# Patient Record
Sex: Female | Born: 1975 | Race: Asian | Hispanic: No | Marital: Married | Smoking: Current every day smoker
Health system: Southern US, Community
[De-identification: ages and names within clinical notes are randomized; demographics above are authoritative.]

## PROBLEM LIST (undated history)

## (undated) DIAGNOSIS — R51 Headache: Secondary | ICD-10-CM

## (undated) DIAGNOSIS — Z9882 Breast implant status: Secondary | ICD-10-CM

## (undated) DIAGNOSIS — Z720 Tobacco use: Secondary | ICD-10-CM

## (undated) DIAGNOSIS — R519 Headache, unspecified: Secondary | ICD-10-CM

## (undated) DIAGNOSIS — R7611 Nonspecific reaction to tuberculin skin test without active tuberculosis: Secondary | ICD-10-CM

## (undated) DIAGNOSIS — D72820 Lymphocytosis (symptomatic): Secondary | ICD-10-CM

## (undated) DIAGNOSIS — R5383 Other fatigue: Secondary | ICD-10-CM

## (undated) DIAGNOSIS — J329 Chronic sinusitis, unspecified: Secondary | ICD-10-CM

## (undated) HISTORY — DX: Other fatigue: R53.83

## (undated) HISTORY — DX: Tobacco use: Z72.0

## (undated) HISTORY — PX: COSMETIC SURGERY: SHX468

## (undated) HISTORY — DX: Headache, unspecified: R51.9

## (undated) HISTORY — DX: Chronic sinusitis, unspecified: J32.9

## (undated) HISTORY — PX: TUBAL LIGATION: SHX77

## (undated) HISTORY — PX: BREAST SURGERY: SHX581

## (undated) HISTORY — DX: Lymphocytosis (symptomatic): D72.820

## (undated) HISTORY — DX: Nonspecific reaction to tuberculin skin test without active tuberculosis: R76.11

## (undated) HISTORY — DX: Headache: R51

## (undated) HISTORY — PX: ENDOMETRIAL ABLATION: SHX621

## (undated) HISTORY — DX: Breast implant status: Z98.82

---

## 2006-10-09 ENCOUNTER — Ambulatory Visit: Payer: Self-pay | Admitting: Internal Medicine

## 2006-10-22 DIAGNOSIS — Z978 Presence of other specified devices: Secondary | ICD-10-CM

## 2006-10-23 ENCOUNTER — Ambulatory Visit: Payer: Self-pay | Admitting: Internal Medicine

## 2006-10-23 LAB — CONVERTED CEMR LAB
Albumin: 3.9 g/dL (ref 3.5–5.2)
GFR calc Af Amer: 236 mL/min
GFR calc non Af Amer: 195 mL/min
HDL: 47.3 mg/dL (ref >39.0)
LDL Cholesterol: 138 mg/dL — ABNORMAL HIGH (ref 0–99)
Potassium: 3.4 meq/L — ABNORMAL LOW (ref 3.5–5.1)
Sodium: 140 meq/L (ref 135–145)
TSH: 1.11 microintl units/mL (ref 0.35–5.50)
Total CHOL/HDL Ratio: 4.2
Triglycerides: 63 mg/dL (ref 0–149)
VLDL: 13 mg/dL (ref 0–40)

## 2006-11-28 ENCOUNTER — Ambulatory Visit: Payer: Self-pay | Admitting: Internal Medicine

## 2006-12-03 ENCOUNTER — Encounter: Admission: RE | Admit: 2006-12-03 | Discharge: 2006-12-03 | Payer: Self-pay | Admitting: Internal Medicine

## 2006-12-04 ENCOUNTER — Telehealth (INDEPENDENT_AMBULATORY_CARE_PROVIDER_SITE_OTHER): Payer: Self-pay | Admitting: *Deleted

## 2007-03-27 ENCOUNTER — Telehealth (INDEPENDENT_AMBULATORY_CARE_PROVIDER_SITE_OTHER): Payer: Self-pay | Admitting: *Deleted

## 2007-04-16 ENCOUNTER — Ambulatory Visit: Payer: Self-pay | Admitting: Internal Medicine

## 2007-04-23 ENCOUNTER — Ambulatory Visit: Payer: Self-pay | Admitting: Internal Medicine

## 2007-06-17 ENCOUNTER — Ambulatory Visit: Payer: Self-pay | Admitting: Internal Medicine

## 2007-06-17 DIAGNOSIS — F17200 Nicotine dependence, unspecified, uncomplicated: Secondary | ICD-10-CM | POA: Insufficient documentation

## 2007-11-19 ENCOUNTER — Ambulatory Visit: Payer: Self-pay | Admitting: Internal Medicine

## 2007-11-19 DIAGNOSIS — R519 Headache, unspecified: Secondary | ICD-10-CM | POA: Insufficient documentation

## 2007-11-19 DIAGNOSIS — R51 Headache: Secondary | ICD-10-CM

## 2007-11-19 DIAGNOSIS — R5381 Other malaise: Secondary | ICD-10-CM

## 2007-11-19 DIAGNOSIS — R5383 Other fatigue: Secondary | ICD-10-CM

## 2007-11-19 LAB — CONVERTED CEMR LAB
ALT: 15 units/L (ref 0–35)
Albumin: 4 g/dL (ref 3.5–5.2)
Alkaline Phosphatase: 48 units/L (ref 39–117)
BUN: 9 mg/dL (ref 6–23)
CO2: 29 meq/L (ref 19–32)
Calcium: 9 mg/dL (ref 8.4–10.5)
Eosinophils Relative: 3.9 % (ref 0.0–5.0)
GFR calc Af Amer: 147 mL/min
Glucose, Bld: 69 mg/dL — ABNORMAL LOW (ref 70–99)
HCT: 38.3 % (ref 36.0–46.0)
Hemoglobin: 13.2 g/dL (ref 12.0–15.0)
Lymphocytes Relative: 48.8 % — ABNORMAL HIGH (ref 12.0–46.0)
Monocytes Absolute: 0.5 10*3/uL (ref 0.1–1.0)
Monocytes Relative: 16.1 % — ABNORMAL HIGH (ref 3.0–12.0)
Neutro Abs: 1 10*3/uL — ABNORMAL LOW (ref 1.4–7.7)
Potassium: 3.9 meq/L (ref 3.5–5.1)
Total Protein: 7.3 g/dL (ref 6.0–8.3)
WBC: 3.3 10*3/uL — ABNORMAL LOW (ref 4.5–10.5)

## 2007-11-20 ENCOUNTER — Encounter (INDEPENDENT_AMBULATORY_CARE_PROVIDER_SITE_OTHER): Payer: Self-pay | Admitting: *Deleted

## 2007-11-20 ENCOUNTER — Telehealth: Payer: Self-pay | Admitting: Internal Medicine

## 2007-11-22 ENCOUNTER — Telehealth: Payer: Self-pay | Admitting: Internal Medicine

## 2008-01-07 ENCOUNTER — Ambulatory Visit: Payer: Self-pay | Admitting: Internal Medicine

## 2008-01-07 DIAGNOSIS — D7289 Other specified disorders of white blood cells: Secondary | ICD-10-CM | POA: Insufficient documentation

## 2008-01-07 DIAGNOSIS — J018 Other acute sinusitis: Secondary | ICD-10-CM

## 2008-01-14 ENCOUNTER — Ambulatory Visit: Payer: Self-pay | Admitting: Pulmonary Disease

## 2008-01-17 ENCOUNTER — Ambulatory Visit: Payer: Self-pay | Admitting: Internal Medicine

## 2008-01-17 DIAGNOSIS — J312 Chronic pharyngitis: Secondary | ICD-10-CM

## 2008-03-11 ENCOUNTER — Ambulatory Visit: Payer: Self-pay | Admitting: Internal Medicine

## 2008-04-22 ENCOUNTER — Ambulatory Visit: Payer: Self-pay | Admitting: *Deleted

## 2008-04-22 DIAGNOSIS — J069 Acute upper respiratory infection, unspecified: Secondary | ICD-10-CM | POA: Insufficient documentation

## 2008-05-13 ENCOUNTER — Ambulatory Visit: Payer: Self-pay | Admitting: Internal Medicine

## 2008-05-13 DIAGNOSIS — F341 Dysthymic disorder: Secondary | ICD-10-CM | POA: Insufficient documentation

## 2008-05-13 DIAGNOSIS — M546 Pain in thoracic spine: Secondary | ICD-10-CM

## 2008-05-13 LAB — CONVERTED CEMR LAB
ALT: 27 units/L (ref 0–35)
CRP, High Sensitivity: <1 — ABNORMAL LOW (ref 0.00–5.00)
Cholesterol: 238 mg/dL (ref 0–200)
Direct LDL: 179.5 mg/dL
Total CHOL/HDL Ratio: 4.2
VLDL: 7 mg/dL (ref 0–40)

## 2008-05-14 ENCOUNTER — Encounter: Payer: Self-pay | Admitting: Internal Medicine

## 2008-06-02 ENCOUNTER — Ambulatory Visit: Payer: Self-pay | Admitting: Internal Medicine

## 2008-06-02 DIAGNOSIS — R070 Pain in throat: Secondary | ICD-10-CM

## 2008-06-02 DIAGNOSIS — E785 Hyperlipidemia, unspecified: Secondary | ICD-10-CM | POA: Insufficient documentation

## 2008-06-09 ENCOUNTER — Ambulatory Visit: Payer: Self-pay | Admitting: *Deleted

## 2008-06-09 DIAGNOSIS — J329 Chronic sinusitis, unspecified: Secondary | ICD-10-CM | POA: Insufficient documentation

## 2008-08-24 ENCOUNTER — Telehealth: Payer: Self-pay | Admitting: Internal Medicine

## 2008-08-24 ENCOUNTER — Ambulatory Visit: Payer: Self-pay | Admitting: Radiology

## 2008-08-24 ENCOUNTER — Ambulatory Visit: Payer: Self-pay | Admitting: Internal Medicine

## 2008-08-24 ENCOUNTER — Ambulatory Visit (HOSPITAL_BASED_OUTPATIENT_CLINIC_OR_DEPARTMENT_OTHER): Admission: RE | Admit: 2008-08-24 | Discharge: 2008-08-24 | Payer: Self-pay | Admitting: Internal Medicine

## 2008-08-24 DIAGNOSIS — R0602 Shortness of breath: Secondary | ICD-10-CM

## 2008-08-24 DIAGNOSIS — R0789 Other chest pain: Secondary | ICD-10-CM | POA: Insufficient documentation

## 2008-08-24 LAB — CONVERTED CEMR LAB
Basophils Absolute: 0 10*3/uL (ref 0.0–0.1)
Basophils Relative: 0.6 % (ref 0.0–3.0)
CO2: 26 meq/L (ref 19–32)
Chloride: 106 meq/L (ref 96–112)
Creatinine, Ser: 0.5 mg/dL (ref 0.4–1.2)
Eosinophils Absolute: 0.1 10*3/uL (ref 0.0–0.7)
Free T4: 0.8 ng/dL (ref 0.6–1.6)
GFR calc non Af Amer: 150 mL/min
Lymphocytes Relative: 36.4 % (ref 12.0–46.0)
MCHC: 34.5 g/dL (ref 30.0–36.0)
MCV: 90.7 fL (ref 78.0–100.0)
Neutrophils Relative %: 51.5 % (ref 43.0–77.0)
Platelets: 214 10*3/uL (ref 150–400)
Potassium: 3.8 meq/L (ref 3.5–5.1)
RBC: 4.13 M/uL (ref 3.87–5.11)
RDW: 12.2 % (ref 11.5–14.6)
Sodium: 140 meq/L (ref 135–145)
TSH: 0.96 microintl units/mL (ref 0.35–5.50)

## 2008-11-10 LAB — CONVERTED CEMR LAB: Pap Smear: NORMAL

## 2009-02-22 ENCOUNTER — Ambulatory Visit: Payer: Self-pay | Admitting: Interventional Radiology

## 2009-02-22 ENCOUNTER — Ambulatory Visit (HOSPITAL_BASED_OUTPATIENT_CLINIC_OR_DEPARTMENT_OTHER): Admission: RE | Admit: 2009-02-22 | Discharge: 2009-02-22 | Payer: Self-pay | Admitting: Internal Medicine

## 2009-02-22 ENCOUNTER — Ambulatory Visit: Payer: Self-pay | Admitting: Internal Medicine

## 2009-02-22 DIAGNOSIS — R509 Fever, unspecified: Secondary | ICD-10-CM

## 2009-02-22 LAB — CONVERTED CEMR LAB
Alkaline Phosphatase: 52 units/L (ref 39–117)
Basophils Absolute: 0 10*3/uL (ref 0.0–0.1)
Basophils Relative: 0 % (ref 0–1)
Bilirubin, Direct: 0.1 mg/dL (ref 0.0–0.3)
Calcium: 8.8 mg/dL (ref 8.4–10.5)
Chloride: 107 meq/L (ref 96–112)
Creatinine, Ser: 0.51 mg/dL (ref 0.40–1.20)
Free T4: 1 ng/dL (ref 0.80–1.80)
Hemoglobin: 12.9 g/dL (ref 12.0–15.0)
Indirect Bilirubin: 0.5 mg/dL (ref 0.0–0.9)
Leukocytes, UA: NEGATIVE
Lymphocytes Relative: 33 % (ref 12–46)
MCHC: 33.8 g/dL (ref 30.0–36.0)
Monocytes Absolute: 0.5 10*3/uL (ref 0.1–1.0)
Monocytes Relative: 7 % (ref 3–12)
Neutro Abs: 4 10*3/uL (ref 1.7–7.7)
Neutrophils Relative %: 58 % (ref 43–77)
Nitrite: NEGATIVE
RBC: 4.3 M/uL (ref 3.87–5.11)
Sed Rate: 14 mm/hr (ref 0–22)
Sodium: 141 meq/L (ref 135–145)
Specific Gravity, Urine: 1.02 (ref 1.005–1.030)
TSH: 0.764 microintl units/mL (ref 0.350–4.500)
Urobilinogen, UA: 0.2 (ref 0.0–1.0)
pH: 6.5 (ref 5.0–8.0)

## 2009-02-23 ENCOUNTER — Telehealth: Payer: Self-pay | Admitting: Internal Medicine

## 2009-04-14 ENCOUNTER — Encounter: Payer: Self-pay | Admitting: Internal Medicine

## 2009-11-23 ENCOUNTER — Ambulatory Visit: Payer: Self-pay | Admitting: Internal Medicine

## 2009-11-23 LAB — CONVERTED CEMR LAB
Rapid Strep: NEGATIVE
TSH: 1.078 microintl units/mL (ref 0.350–4.500)

## 2009-11-24 ENCOUNTER — Encounter: Payer: Self-pay | Admitting: Internal Medicine

## 2009-12-08 ENCOUNTER — Telehealth: Payer: Self-pay | Admitting: Internal Medicine

## 2009-12-20 ENCOUNTER — Telehealth: Payer: Self-pay | Admitting: Internal Medicine

## 2009-12-21 ENCOUNTER — Ambulatory Visit: Payer: Self-pay | Admitting: Internal Medicine

## 2009-12-21 DIAGNOSIS — L299 Pruritus, unspecified: Secondary | ICD-10-CM | POA: Insufficient documentation

## 2010-02-22 ENCOUNTER — Ambulatory Visit: Payer: Self-pay | Admitting: Internal Medicine

## 2010-03-23 ENCOUNTER — Ambulatory Visit: Payer: Self-pay | Admitting: Internal Medicine

## 2010-03-23 LAB — CONVERTED CEMR LAB
ALT: 35 units/L (ref 0–35)
AST: 24 units/L (ref 0–37)
Albumin: 4.5 g/dL (ref 3.5–5.2)
Bilirubin, Direct: 0.1 mg/dL (ref 0.0–0.3)
Cholesterol: 236 mg/dL — ABNORMAL HIGH (ref 0–200)
HDL: 50 mg/dL (ref >39)
Total CHOL/HDL Ratio: 4.7
Total Protein: 7.2 g/dL (ref 6.0–8.3)
Triglycerides: 91 mg/dL (ref <150)

## 2010-03-24 ENCOUNTER — Telehealth: Payer: Self-pay | Admitting: Internal Medicine

## 2010-03-24 ENCOUNTER — Encounter: Payer: Self-pay | Admitting: Internal Medicine

## 2010-04-14 ENCOUNTER — Telehealth: Payer: Self-pay | Admitting: Internal Medicine

## 2010-04-19 ENCOUNTER — Ambulatory Visit: Payer: Self-pay | Admitting: Internal Medicine

## 2010-07-12 NOTE — Progress Notes (Signed)
Summary: Medication Status  Phone Note Call from Patient Call back at [facility] Phone [phone] Call back at Work Phone [phone]   Caller: Patient Call For: D. Thomos Lemons DO Summary of Call: patient called and left voice message regarding medication changes.  Call was returned to patient at [phone], no answer. A detailed voice message was left informing patient of medication change and 6 month follow up fasting blood work. Message was left for patient to call if any questions. Initial call taken by: Glendell Docker CMA,  April 14, 2010 3:31 PM  Follow-up for Phone Call        call placed to patient at [phone],  she states she wanted to know if her cholesterol was still high. She was informed that her lipids are still elevated and Dr Artist Pais suggests she schedule a follow up to discuss this. She has scheduled an appointment for 04/19/10 @ 10:30 am with Dr Artist Pais Follow-up by: Glendell Docker CMA,  April 18, 2010 9:47 AM

## 2010-07-12 NOTE — Assessment & Plan Note (Signed)
Summary: HAND IRRITATION/DK--Rm    Vital Signs:  Patient profile:   35 year old female Height:      62 inches Weight:      156.25 pounds BMI:     28.68 Temp:     97.7 degrees F oral Pulse rate:   66 / minute Pulse rhythm:   regular Resp:     16 per minute BP sitting:   100 / 70  (left arm) Cuff size:   regular  Vitals Entered By: Mervin Kung CMA Duncan Dull) (December 21, 2009 3:37 PM) CC: Room  2      Itching on left hand since starting Lovastating, not relieved by OTC creams. Is Patient Diabetic? No   Primary Care Provider:  Dondra Spry DO  CC:  Room  2      Itching on left hand since starting Lovastating and not relieved by OTC creams..  History of Present Illness: 35 year old Asian female for followup regarding hyperlipidemia Pt has stopped Lovastatin due to possible side effect. She noticed increase in hand pruritus since starting lovastatin She is exposed to detergents and chemicals in the workplace She owns and operates a nail salon  Allergies: 1)  ! Codeine 2)  ! Keflex 3)  ! Naproxen  Past History:  Past Medical History: RHINOSINUSITIS, ACUTE (ICD-461.8) LYMPHOCYTOSIS (ICD-288.8)  FATIGUE (ICD-780.79)    HEADACHE (ICD-784.0)   TOBACCO ABUSE (ICD-305.1) TB SKIN TEST, POSITIVE (ICD-795.5) BREAST IMPLANTS, BILATERAL, HX OF (ICD-V43.82)        Past Surgical History: tummy tuck breast implants G2P2 Tubal ligation   Hx of endometrial ablation     Family History: none per pt report.         Social History: Married Alcohol use-no  Drug use-no Current Smoker   pt has children.  pt works at a [employer] - self employed     Physical Exam  General:  alert, well-developed, and well-nourished.   Lungs:  normal respiratory effort and normal breath sounds.   Heart:  normal rate, regular rhythm, and no gallop.   Skin:  color normal, no rashes, and no suspicious lesions.     Impression & Recommendations:  Problem # 1:  PRURITUS (ICD-4.12) 35 year old  Asian female with bilateral hand pruritus of unclear etiology. I doubt symptoms related to recent start of statin medication. She works with detergents and chemicals at [employer]. Her symptoms may be attributable to allergic/occupational exposure. Patient advised to try topical steroid and use gloves at work.  if persistent symptoms consider referral to dermatology  Complete Medication List: 1)  Lovastatin 20 Mg Tabs (Lovastatin) .... One by mouth once daily 2)  Triamcinolone Acetonide 0.5 % Crea (Triamcinolone acetonide) .... Apply two times a day  Patient Instructions: 1)  Call our office if your symptoms do not  improve or gets worse. 2)  The highlighted prescriptions were electronically sent to your pharmacy Prescriptions: TRIAMCINOLONE ACETONIDE 0.5 % CREA (TRIAMCINOLONE ACETONIDE) apply two times a day  #30 grams x 0   Entered and Authorized by:   D. Thomos Lemons DO   Signed by:   D. Thomos Lemons DO on 12/21/2009   Method used:   Electronically to        [facility] (retail)       [address]       [place], [place]  [address]       Ph: [phone]       Fax: [phone]   RxID:   [id number]   Current  Allergies (reviewed today): ! CODEINE ! KEFLEX ! NAPROXEN

## 2010-07-12 NOTE — Miscellaneous (Signed)
Summary: Future Lab Orders  Clinical Lists Changes  Orders: Added new Test order of T-Lipid Profile [phone]) - Signed Added new Test order of TLB-ALT (SGPT) (84460-ALT) - Signed Added new Test order of TLB-AST (SGOT) (84450-SGOT) - Signed

## 2010-07-12 NOTE — Assessment & Plan Note (Signed)
Summary: cpx/dt   Vital Signs:  Patient profile:   35 year old female Height:      62 inches Weight:      156.75 pounds BMI:     28.77 Temp:     97.9 degrees F oral Pulse rate:   66 / minute Pulse rhythm:   regular Resp:     16 per minute BP sitting:   100 / 80  (right arm) Cuff size:   regular  Vitals Entered By: Mervin Kung CMA (November 23, 2009 11:23 AM) Is Patient Diabetic? No Comments Pt is taking hydroxyzine ?30?mg 2 at bedtime.     Primary Care Provider:  Dondra Spry DO   History of Present Illness: 35 y/o Asian female for routine CPX  Also having sore throat and head congestion x 1 week.  Pt states she would like her thyroid checked due to wt gain    Allergies: 1)  ! Codeine 2)  ! Keflex 3)  ! Naproxen  Past History:  Past Medical History: RHINOSINUSITIS, ACUTE (ICD-461.8) LYMPHOCYTOSIS (ICD-288.8)  FATIGUE (ICD-780.79)   HEADACHE (ICD-784.0)   TOBACCO ABUSE (ICD-305.1) TB SKIN TEST, POSITIVE (ICD-795.5) BREAST IMPLANTS, BILATERAL, HX OF (ICD-V43.82)        Past Surgical History: tummy tuck breast implants G2P2 Tubal ligation  Hx of endometrial ablation     Family History: none per pt report.        Social History: Married Alcohol use-no Drug use-no Current Smoker   pt has children.  pt works at a [employer] - self employed     Review of Systems       The patient complains of weight gain.  The patient denies chest pain, syncope, dyspnea on exertion, abdominal pain, melena, hematochezia, severe indigestion/heartburn, and depression.    Physical Exam  General:  alert, well-developed, and well-nourished.   Head:  normocephalic and atraumatic.   Ears:  R ear normal and L ear normal.   Mouth:  postnasal drip.   Neck:  No deformities, masses, or tenderness noted. Lungs:  normal respiratory effort, normal breath sounds, no crackles, and no wheezes.   Heart:  normal rate, regular rhythm, no murmur, and no gallop.   Abdomen:  soft,  non-tender, normal bowel sounds, no hepatomegaly, and no splenomegaly.   Extremities:  No lower extremity edema  Neurologic:  cranial nerves II-XII intact and gait normal.   Psych:  normally interactive, good eye contact, not anxious appearing, and not depressed appearing.     Impression & Recommendations:  Problem # 1:  HEALTH MAINTENANCE EXAM (ICD-V70.0) Pt counseled on diet and exercise. Reviewed adult health maintenance protocols.  Pap smear: normal (11/10/2008) Td Booster: Tdap (11/23/2009)   Flu Vax: Fluvax Non-MCR (03/11/2008)   Pneumovax: Pneumovax (03/11/2008) Chol: 238 (05/13/2008)   HDL: 57.0 (05/13/2008)   LDL: DEL (05/13/2008)   TG: 37 (05/13/2008) TSH: 0.764 (02/22/2009)     Problem # 2:  URI (ICD-465.9)  Instructed on symptomatic treatment. Call if symptoms persist or worsen.   Complete Medication List: 1)  Pravastatin Sodium 20 Mg Tabs (Pravastatin sodium) .... One by mouth once daily  Other Orders: Rapid Strep ([address]) Tdap => 78yrs IM ([address]) Admin 1st Vaccine ([address])  Patient Instructions: 1)  Take Claritin D 12 hrs once daily as needed 2)  http://www.my-calorie-counter.com/ 3)  Limit your calories to 1300-1400 cal per day 4)  Eat breakfast that includes some protein (boiled eggs, piece of chicken,  soy milk with 1/2 cup  of  cereal. 5)  Please schedule a follow-up appointment in 1 year.  Current Allergies (reviewed today): ! CODEINE ! KEFLEX ! NAPROXEN   Preventive Care Screening  Pap Smear:    Date:  11/10/2008    Results:  normal    Laboratory Results    Other Tests  Rapid Strep: negative  Kit Test Internal QC: Positive   (Normal Range: Negative)    Immunizations Administered:  Tetanus Vaccine:    Vaccine Type: Tdap    Site: right deltoid    Mfr: GlaxoSmithKline    Dose: 0.5 ml    Route: IM    Given by: Mervin Kung CMA    Exp. Date: 09/03/2011    Lot #: [id number]

## 2010-07-12 NOTE — Assessment & Plan Note (Signed)
Summary: follow up/hea 9:45 rsch per pt/dt   Vital Signs:  Patient profile:   35 year old female Height:      62 inches Weight:      152.75 pounds BMI:     28.04 O2 Sat:      97 % Temp:     97.8 degrees F oral Pulse rate:   63 / minute Pulse rhythm:   regular Resp:     16 per minute BP sitting:   100 / 80  (left arm) Cuff size:   regular  Vitals Entered By: Glendell Docker CMA (March 23, 2010 9:53 AM) CC: follow-up visit Is Patient Diabetic? No Pain Assessment Patient in pain? no      Comments recheck of cholesterol, blood work was to be done in September, lab orders in lab   Primary Care Provider:  D. Thomos Lemons DO  CC:  follow-up visit.  History of Present Illness: 35 y/o female c/o of few days of shortness of breath some burning sensation in chest  harder to breath symptoms are nocturnal    Allergies: 1)  ! Codeine 2)  ! Keflex 3)  ! Naproxen  Past History:  Past Medical History: RHINOSINUSITIS, ACUTE (ICD-461.8) LYMPHOCYTOSIS (ICD-288.8)   FATIGUE (ICD-780.79)    HEADACHE (ICD-784.0)   TOBACCO ABUSE (ICD-305.1) TB SKIN TEST, POSITIVE (ICD-795.5) BREAST IMPLANTS, BILATERAL, HX OF (ICD-V43.82)        Physical Exam  General:  alert, well-developed, and well-nourished.   Lungs:  normal respiratory effort and normal breath sounds.   Heart:  normal rate, regular rhythm, and no gallop.     Impression & Recommendations:  Problem # 1:  SHORTNESS OF BREATH (ICD-786.05) 35 y/o with intermittent SOB.  She may have mild asthma.  reflux may also be exacerbating factor.  Use PPI regularly work on smoking cessation  Problem # 2:  HYPERLIPIDEMIA (ICD-272.4)  Her updated medication list for this problem includes:    Lovastatin 40 Mg Tabs (Lovastatin) ..... One by mouth once daily  Orders: T-Hepatic Function [phone]) T-Lipid Profile [phone])  Labs Reviewed: SGOT: 29 (02/22/2009)   SGPT: 36 (02/22/2009)   HDL:47 (11/23/2009), 57.0  (05/13/2008)  LDL:172 (11/23/2009), DEL (05/13/2008)  Chol:241 (11/23/2009), 238 (05/13/2008)  Trig:112 (11/23/2009), 37 (05/13/2008)  Complete Medication List: 1)  Lovastatin 40 Mg Tabs (Lovastatin) .... One by mouth once daily 2)  Omeprazole-sodium Bicarbonate 40-1100 Mg Caps (Omeprazole-sodium bicarbonate) .... One by mouth once daily  Other Orders: Flu Vaccine 89yrs + ([address]) Admin 1st Vaccine (13086)  Patient Instructions: 1)  Please stop smoking 2)  Stop Smoking Tips: Choose a Quit date. Cut down before the Quit date. decide what you will do as a substitute when you feel the urge to smoke(gum,toothpick,exercise). 3)  Please schedule a follow-up appointment in 6 months.   4)  Call our office if your symptoms do not  improve or gets worse. Prescriptions: OMEPRAZOLE-SODIUM BICARBONATE 40-1100 MG CAPS (OMEPRAZOLE-SODIUM BICARBONATE) one by mouth once daily  #90 x 1   Entered and Authorized by:   D. Thomos Lemons DO   Signed by:   D. Thomos Lemons DO on 03/23/2010   Method used:   Electronically to        [facility] (retail)       [address]       [place], [place]  [address]       Ph: [phone]       Fax: [phone]   RxID:   [id number]   Current Allergies (  reviewed today): ! CODEINE ! KEFLEX ! NAPROXEN   Immunizations Administered:  Influenza Vaccine # 1:    Vaccine Type: Fluvax 3+    Site: left deltoid    Mfr: GlaxoSmithKline    Dose: 0.5 ml    Route: IM    Given by: Glendell Docker CMA    Exp. Date: 12/10/2010    Lot #: [id number]    VIS given: 01/04/10 version given March 23, 2010.  Flu Vaccine Consent Questions:    Do you have a history of severe allergic reactions to this vaccine? no    Any prior history of allergic reactions to egg and/or gelatin? no    Do you have a sensitivity to the preservative Thimersol? no    Do you have a past history of Guillan-Barre Syndrome? no    Do you currently have an acute febrile illness? no    Have you ever had a severe  reaction to latex? no    Vaccine information given and explained to patient? yes    Are you currently pregnant? no

## 2010-07-12 NOTE — Progress Notes (Signed)
Summary: Medication Status  Phone Note Call from Patient Call back at [facility] Phone [phone] Call back at Work Phone [phone]   Caller: Patient Call For: D. Thomos Lemons DO Summary of Call: patient called and left voice message stating she needed  to know if medication was sent to her pharmacy Initial call taken by: Glendell Docker CMA,  March 24, 2010 4:50 PM  Follow-up for Phone Call        call returned to patient for clarification of voice message at [phone], no answer, no voice mail. Call placed to [phone], female individual stated patient was not  available Follow-up by: Glendell Docker CMA,  March 25, 2010 10:40 AM  Additional Follow-up for Phone Call Additional follow up Details #1::        no retrun call from patient  Additional Follow-up by: Glendell Docker CMA,  March 28, 2010 5:30 PM

## 2010-07-12 NOTE — Progress Notes (Signed)
Summary: Hand Irritation  Phone Note Call from Patient Call back at Work Phone [phone] Call back at [phone]   Caller: Patient Call For: D. Thomos Lemons DO Summary of Call: patient called and left voice message stating her hands are still very irritated, and is not getting any better. She was advised to schedule a office visit  for evaluation. Appointment given for 12/21/2009 @ 3:15 pm with Dr Artist Pais Initial call taken by: Glendell Docker CMA,  December 20, 2009 3:10 PM

## 2010-07-12 NOTE — Assessment & Plan Note (Signed)
Summary: DISCUSS ELEVATED LIPIDS/DK   Vital Signs:  Patient profile:   35 year old female Height:      62 inches Weight:      150 pounds BMI:     27.53 O2 Sat:      99 % on Room air Temp:     97.8 degrees F oral Pulse rate:   90 / minute Pulse rhythm:   regular Resp:     18 per minute BP sitting:   90 / 60  (right arm) Cuff size:   regular  Vitals Entered By: Glendell Docker CMA (April 19, 2010 10:40 AM)  O2 Flow:  Room air CC: follow-up visit Is Patient Diabetic? No Pain Assessment Patient in pain? no      Comments dicuss cholesterol results, and medication    Primary Care Provider:  Dondra Spry DO  CC:  follow-up visit.  History of Present Illness: 35 y/o Asian female for f/u re:  hyperlipidema  good med compliance no side effects noted no fam hx of premature CAD  Current Diet: Breakfast: no breakfast Lunch:  rice,  soup, chicken or fish DInner:  occ protein drink,  rice / noodles (fish, chicken, pork)dessert - coconut Snacks:  Beverage: diet coke   Allergies: 1)  ! Codeine 2)  ! Keflex 3)  ! Naproxen  Past History:  Past Medical History: RHINOSINUSITIS, ACUTE (ICD-461.8) LYMPHOCYTOSIS (ICD-288.8)   FATIGUE (ICD-780.79)    HEADACHE (ICD-784.0)    TOBACCO ABUSE (ICD-305.1) TB SKIN TEST, POSITIVE (ICD-795.5) BREAST IMPLANTS, BILATERAL, HX OF (ICD-V43.82)        Past Surgical History: tummy tuck breast implants G2P2 Tubal ligation    Hx of endometrial ablation     Family History: none per pt report.           Physical Exam  General:  alert, well-developed, and well-nourished.   Lungs:  normal respiratory effort and normal breath sounds.   Heart:  normal rate, regular rhythm, and no gallop.     Impression & Recommendations:  Problem # 1:  HYPERLIPIDEMIA (ICD-272.4) Assessment Unchanged question compliance with lovastatin reiterated low sat fat diet  Her updated medication list for this problem includes:    Lovastatin 40 Mg  Tabs (Lovastatin) ..... One by mouth once daily  Labs Reviewed: SGOT: 24 (03/23/2010)   SGPT: 35 (03/23/2010)   HDL:50 (03/23/2010), 47 (11/23/2009)  LDL:168 (03/23/2010), 172 (11/23/2009)  Chol:236 (03/23/2010), 241 (11/23/2009)  Trig:91 (03/23/2010), 112 (11/23/2009)  Complete Medication List: 1)  Lovastatin 40 Mg Tabs (Lovastatin) .... One by mouth once daily 2)  Omeprazole-sodium Bicarbonate 40-1100 Mg Caps (Omeprazole-sodium bicarbonate) .... One by mouth once daily 3)  Qc Multivitamin Cholesterol Tabs (Specialty vitamins products) .... Take 1 tablet by mouth three times a day  Patient Instructions: 1)  Please schedule a follow-up appointment in 6 months. 2)  Hepatic Panel prior to visit, ICD-9:  272.4 3)  Lipid Panel prior to visit, ICD-9:  272.4 4)  Please return for lab work one (1) week before your next appointment.    Orders Added: 1)  Est. Patient Level II [14782]    Current Allergies (reviewed today): ! CODEINE ! KEFLEX ! NAPROXEN

## 2010-07-12 NOTE — Progress Notes (Signed)
Summary: Medication Concern  Phone Note Call from Patient Call back at Work Phone [phone]   Caller: Patient Call For: D. Thomos Lemons DO Summary of Call: patient called and left voice message stating since she has started taking the cholesterol medication for the past 2 weeks she has had tingling  and swelling in her hands.  Call placed to patient at [phone], she states that 2 days after she started the cholesterol medication she has had severe irritation and swelling in her hands.  She states that she has stopped taking the medication a couple of days aog and would like to know if Dr Artist Pais wants to switch her to something else. Initial call taken by: Glendell Docker CMA,  December 08, 2009 4:00 PM  Follow-up for Phone Call        patient advised of medication change Follow-up by: Glendell Docker CMA,  December 09, 2009 3:09 PM    New/Updated Medications: LOVASTATIN 20 MG TABS (LOVASTATIN) one by mouth once daily Prescriptions: LOVASTATIN 20 MG TABS (LOVASTATIN) one by mouth once daily  #30 x 2   Entered and Authorized by:   D. Thomos Lemons DO   Signed by:   D. Thomos Lemons DO on 12/09/2009   Method used:   Electronically to        [facility] (retail)       [address]       [place], [place]  [address]       Ph: [phone]       Fax: [phone]   RxID:   [id number]

## 2010-07-12 NOTE — Letter (Signed)
   [place] at [facility] [address] [place], [place]  [address]  [place] Phone: [phone]      November 24, 2009   Kimberly Richmond [address] [place], [place] [address]  RE:  LAB RESULTS  Dear  Ms. HANSSEN,  The following is an interpretation of your most recent lab tests.  Please take note of any instructions provided or changes to medications that have resulted from your lab work.  LIPID PANEL:  Abnormal - schedule a follow-up appointment Triglyceride: 112   Cholesterol: 241   LDL: 172   HDL: 47   Chol/HDL%:  5.1 Ratio  THYROID STUDIES:  Thyroid studies normal TSH: 1.078     Your bad cholesterol is too high.  Please follow low saturated fat diet.  I also suggest you take cholesterol lowering medication.   We sent an electronic prescription to your local pharmacy.  You need to return to our office for follow up blood test within 3 months of starting medication.       Medications Prescribed or Changed PRAVASTATIN SODIUM 20 MG TABS (PRAVASTATIN SODIUM) one by mouth once daily   Sincerely Yours,    Dr. Thomos Lemons

## 2010-07-12 NOTE — Letter (Signed)
   [place] at [facility] [address] [place], [place]  [address]  [place] Phone: [phone]      March 24, 2010   Kimberly Richmond [address] [place], [place] [address]  RE:  LAB RESULTS  Dear  Ms. OUTTEN,  The following is an interpretation of your most recent lab tests.  Please take note of any instructions provided or changes to medications that have resulted from your lab work.  LIVER FUNCTION TESTS:  Good - no changes needed  LIPID PANEL:  Abnormal - schedule a follow-up appointment Triglyceride: 91   Cholesterol: 236   LDL: 168   HDL: 50   Chol/HDL%:  4.7 Ratio   Your cholesterol is still too high.   Please take higher dose of cholesterol medication and follow low saturated fat diet as discussed.  Return in 6 months for follow up blood test.       Medications Prescribed or Changed LOVASTATIN 40 MG TABS (LOVASTATIN) one by mouth once daily   Sincerely Yours,    Dr. Thomos Lemons

## 2010-11-23 ENCOUNTER — Encounter: Payer: Self-pay | Admitting: Internal Medicine

## 2010-11-24 ENCOUNTER — Ambulatory Visit: Payer: Self-pay | Admitting: Family Medicine

## 2011-02-28 ENCOUNTER — Ambulatory Visit (INDEPENDENT_AMBULATORY_CARE_PROVIDER_SITE_OTHER): Payer: Self-pay | Admitting: Internal Medicine

## 2011-02-28 ENCOUNTER — Encounter: Payer: Self-pay | Admitting: Internal Medicine

## 2011-02-28 DIAGNOSIS — R0602 Shortness of breath: Secondary | ICD-10-CM

## 2011-02-28 DIAGNOSIS — R635 Abnormal weight gain: Secondary | ICD-10-CM | POA: Insufficient documentation

## 2011-02-28 DIAGNOSIS — E785 Hyperlipidemia, unspecified: Secondary | ICD-10-CM

## 2011-02-28 MED ORDER — FUROSEMIDE 20 MG PO TABS: 20.0000 mg | ORAL_TABLET | ORAL | Status: DC

## 2011-02-28 MED ORDER — PHENTERMINE HCL 37.5 MG PO CAPS: 37.5000 mg | ORAL_CAPSULE | ORAL | Status: AC

## 2011-02-28 MED ORDER — PRAVASTATIN SODIUM 40 MG PO TABS: 40.0000 mg | ORAL_TABLET | Freq: Every day | ORAL | Status: DC

## 2011-02-28 MED ORDER — ALBUTEROL SULFATE HFA 108 (90 BASE) MCG/ACT IN AERS: 2.0000 | INHALATION_SPRAY | Freq: Four times a day (QID) | RESPIRATORY_TRACT | Status: AC | PRN

## 2011-02-28 NOTE — Assessment & Plan Note (Signed)
Chronic intermittent. Attempt albuterol md prn. Wt loss. Followup if no improvement or worsening.

## 2011-02-28 NOTE — Assessment & Plan Note (Signed)
tsh reportedly nl. Re-attempt phentermine. Monitor bp and hr. Diet and exercise. followup one month.

## 2011-02-28 NOTE — Progress Notes (Signed)
  Subjective:    Patient ID: Kimberly Richmond, female    DOB: 25-Jul-1975, 35 y.o.   MRN: [id number]  HPI Pt presents to clinic for followup of multiple medical problems. H/o hyperlipidemia taking statin therapy. States lipitor is causing myalgias and paresthesias. Was told recently by gyn that chol was high. C/o mild intermittent dyspnea not related to exertion or cp. No wheezing or significant cough. Recalls recent reportedly nl cxr. No calf/leg swelling but has intermittent mild edema of feet and hands. Seems to occur perimenstraul. No other complaints.  Past Medical History  Diagnosis Date  . Rhinosinusitis   . Lymphocytosis   . Fatigue   . Head ache   . Tobacco abuse   . Positive TB test   . History of breast implant     bilateral   Past Surgical History  Procedure Date  . Breast surgery     bilateral breast implants  . Cosmetic surgery     tummy tuck  . Tubal ligation   . Endometrial ablation     reports that she has been smoking.  She has never used smokeless tobacco. She reports that she does not drink alcohol or use illicit drugs. family history is not on file. Allergies  Allergen Reactions  . Cephalexin   . Codeine   . Naproxen      Review of Systems see hpi     Objective:   Physical Exam  Physical Exam  Nursing note and vitals reviewed. Constitutional: Appears well-developed and well-nourished. No distress.  HENT:  Head: Normocephalic and atraumatic.  Right Ear: External ear normal.  Left Ear: External ear normal.  Eyes: Conjunctivae are normal. No scleral icterus.  Neck: Neck supple. Carotid bruit is not present.  Cardiovascular: Normal rate, regular rhythm and normal heart sounds.  Exam reveals no gallop and no friction rub.   No murmur heard. Pulmonary/Chest: Effort normal and breath sounds normal. No respiratory distress. He has no wheezes. no rales.  Lymphadenopathy:    He has no cervical adenopathy.  Neurological:Alert.  Skin: Skin is warm and dry. Not  diaphoretic.  Psychiatric: Has a normal mood and affect.   Ext: no calf swelling/palpable cords. Trace le edema.     Assessment & Plan:

## 2011-02-28 NOTE — Assessment & Plan Note (Signed)
Stop lipitor. Attempt pravachol. Obtain lipid/lft in one month

## 2011-04-11 ENCOUNTER — Telehealth: Payer: Self-pay | Admitting: Internal Medicine

## 2011-04-11 DIAGNOSIS — E785 Hyperlipidemia, unspecified: Secondary | ICD-10-CM

## 2011-04-11 MED ORDER — PHENTERMINE HCL 37.5 MG PO CAPS: 37.5000 mg | ORAL_CAPSULE | ORAL | Status: AC

## 2011-04-11 MED ORDER — PRAVASTATIN SODIUM 40 MG PO TABS: 40.0000 mg | ORAL_TABLET | Freq: Every day | ORAL | Status: DC

## 2011-04-11 NOTE — Telephone Encounter (Signed)
REFILL PRAVASTATIN 40 MG TAB QTY 30 TAKE 1 TABLET DAILY LAST FILL 02-28-2011 REFILL PHENTERMINE 37.5 MG TABLET QTY 30 TAKE 1 TABLET DAILY LAST FILL 02-28-2011

## 2011-04-11 NOTE — Telephone Encounter (Signed)
Ok to fill but was recommended for 1 month lipid/lft 272.4 as well. Needs that if taking pravachol. Also needs f/u appt

## 2011-04-11 NOTE — Telephone Encounter (Signed)
Call placed to [facility] at [phone]; spoke with [facility], she was asked if electronic refill was on file from September 2012. She stated there were no Rx on file for requested medication.  Call placed to patient at [phone], no answer. A detailed voice message was left informing per Dr Rodena Medin instructions. Message was left for patient to call back to schedule one month follow up. Lab orders entered for [facility] for November 2012. Rx printed and faxed to [facility].

## 2011-04-11 NOTE — Telephone Encounter (Signed)
Last office visit advised one month follow up.There are no future appointments on file. Is it okay to provide a refill on Phentermine?

## 2011-05-24 ENCOUNTER — Telehealth: Payer: Self-pay | Admitting: Internal Medicine

## 2011-05-24 NOTE — Telephone Encounter (Signed)
Refill- phentermine 37.5mg  tablet. Take one tablet daily. Qty 30 last fill 10.30.12

## 2011-05-24 NOTE — Telephone Encounter (Signed)
Refill for phentermine denied. Office visit needed; will address at visit scheduled 05/30/2011.

## 2011-05-30 ENCOUNTER — Ambulatory Visit: Payer: Self-pay | Admitting: Internal Medicine

## 2011-06-16 ENCOUNTER — Ambulatory Visit: Payer: Self-pay | Admitting: Internal Medicine

## 2011-06-20 ENCOUNTER — Encounter: Payer: Self-pay | Admitting: Internal Medicine

## 2011-06-20 ENCOUNTER — Ambulatory Visit (INDEPENDENT_AMBULATORY_CARE_PROVIDER_SITE_OTHER): Payer: Self-pay | Admitting: Internal Medicine

## 2011-06-20 DIAGNOSIS — R5383 Other fatigue: Secondary | ICD-10-CM

## 2011-06-20 DIAGNOSIS — E785 Hyperlipidemia, unspecified: Secondary | ICD-10-CM

## 2011-06-20 DIAGNOSIS — R5381 Other malaise: Secondary | ICD-10-CM

## 2011-06-20 DIAGNOSIS — F17200 Nicotine dependence, unspecified, uncomplicated: Secondary | ICD-10-CM

## 2011-06-20 DIAGNOSIS — R635 Abnormal weight gain: Secondary | ICD-10-CM

## 2011-06-20 DIAGNOSIS — J40 Bronchitis, not specified as acute or chronic: Secondary | ICD-10-CM | POA: Insufficient documentation

## 2011-06-20 MED ORDER — AZITHROMYCIN 250 MG PO TABS
ORAL_TABLET | ORAL | Status: AC
Start: 2011-06-20 — End: 2011-06-25

## 2011-06-20 MED ORDER — BUPROPION HCL ER (SR) 150 MG PO TB12: 150.0000 mg | ORAL_TABLET | Freq: Two times a day (BID) | ORAL | Status: DC

## 2011-06-20 MED ORDER — PHENTERMINE HCL 37.5 MG PO CAPS: 37.5000 mg | ORAL_CAPSULE | ORAL | Status: AC

## 2011-06-20 NOTE — Assessment & Plan Note (Signed)
Attempt zpak. Followup if no improvement or worsening.  

## 2011-06-20 NOTE — Assessment & Plan Note (Signed)
Attempt wellbutrin 

## 2011-06-20 NOTE — Progress Notes (Signed)
  Subjective:    Patient ID: Kimberly Richmond, female    DOB: 1975-09-16, 36 y.o.   MRN: [id number]  HPI Pt presents to clinic for followup of multiple medical problems. S/p change of lipitor to pravastatin and tolerating. Did not follow up for labwork. Notes over one week h/o NP cough, nasal drainage and congestion. No f/c. Smoking 1ppd and wishes help with cessation. Tried chantix but couldn't tolerate due to nausea. Notes chronic fatigue. Has lost 8lbs since last visit and wishes to reattempt phentermine. No other complaints.   Past Medical History  Diagnosis Date  . Rhinosinusitis   . Lymphocytosis   . Fatigue   . Head ache   . Tobacco abuse   . Positive TB test   . History of breast implant     bilateral   Past Surgical History  Procedure Date  . Breast surgery     bilateral breast implants  . Cosmetic surgery     tummy tuck  . Tubal ligation   . Endometrial ablation     reports that she has been smoking.  She has never used smokeless tobacco. She reports that she does not drink alcohol or use illicit drugs. family history is not on file. Allergies  Allergen Reactions  . Cephalexin   . Codeine   . Naproxen      Review of Systems see hpi     Objective:   Physical Exam  Constitutional: She appears well-developed and well-nourished. No distress.  HENT:  Head: Normocephalic and atraumatic.  Right Ear: External ear normal.  Left Ear: External ear normal.  Nose: Nose normal.  Mouth/Throat: Oropharynx is clear and moist. No oropharyngeal exudate.  Eyes: Conjunctivae are normal. No scleral icterus.  Neck: Neck supple.  Cardiovascular: Normal rate, regular rhythm and normal heart sounds.   Pulmonary/Chest: Effort normal and breath sounds normal. No respiratory distress. She has no wheezes. She has no rales.  Neurological: She is alert.  Skin: Skin is warm and dry. She is not diaphoretic.  Psychiatric: She has a normal mood and affect.          Assessment & Plan:

## 2011-06-20 NOTE — Patient Instructions (Addendum)
Please schedule fasting labs for tomorrow Weds 06/21/2011 Lipid/lft 272.4, cbc, chem7, b12, tsh (fatigue) Also please schedule lipid/lft 272.4 prior to your next appointment

## 2011-06-20 NOTE — Assessment & Plan Note (Signed)
Obtain lipid/lft. 

## 2011-06-20 NOTE — Assessment & Plan Note (Signed)
Obtain cbc, chem7, tsh, b12

## 2011-06-20 NOTE — Assessment & Plan Note (Signed)
reattempt two months of phentermine with diet/exercise. Monitor bp while using

## 2011-08-21 ENCOUNTER — Telehealth: Payer: Self-pay | Admitting: *Deleted

## 2011-08-21 NOTE — Telephone Encounter (Signed)
Patient c/o shortness of breath 2 days ago, chills and body aches, she denies pain with deep breaths, denies clammy skin, denies neck and jaw pain, denies fever, productive cough, she does state she is having bad chest discomfort. Patient states she is concerned because her cholesterol has been elevated and she fears that she might be having a heart attack. Patient was advised to be seen in the ER for evaluation of her chest pain, and to schedule a follow up in the near future to discuss her cholesterol results with Dr Rodena Medin.  Patient has verbalized understanding to seek care in ER for chest pain.

## 2011-08-31 ENCOUNTER — Ambulatory Visit: Payer: Self-pay | Admitting: Internal Medicine

## 2011-10-09 LAB — HEPATIC FUNCTION PANEL
ALT: 41 U/L — ABNORMAL HIGH (ref 0–35)
Bilirubin, Direct: 0.1 mg/dL (ref 0.0–0.3)
Indirect Bilirubin: 0.5 mg/dL (ref 0.0–0.9)

## 2011-10-09 LAB — LIPID PANEL
Cholesterol: 222 mg/dL — ABNORMAL HIGH (ref 0–200)
VLDL: 35 mg/dL (ref 0–40)

## 2011-10-09 NOTE — Telephone Encounter (Signed)
Pt presented to the lab, orders were released and given to the lab.

## 2011-10-09 NOTE — Telephone Encounter (Signed)
Addended by: Mervin Kung A on: 10/09/2011 10:47 AM   Modules accepted: Orders

## 2011-10-11 ENCOUNTER — Ambulatory Visit: Payer: Self-pay | Admitting: Internal Medicine

## 2011-10-12 ENCOUNTER — Telehealth: Payer: Self-pay | Admitting: Internal Medicine

## 2011-10-12 ENCOUNTER — Encounter: Payer: Self-pay | Admitting: Internal Medicine

## 2011-10-12 ENCOUNTER — Ambulatory Visit (INDEPENDENT_AMBULATORY_CARE_PROVIDER_SITE_OTHER): Payer: Self-pay | Admitting: Internal Medicine

## 2011-10-12 VITALS — BP 100/68 | HR 90 | Temp 98.3°F | Resp 18 | Ht 62.0 in | Wt 158.0 lb

## 2011-10-12 DIAGNOSIS — R635 Abnormal weight gain: Secondary | ICD-10-CM

## 2011-10-12 DIAGNOSIS — E785 Hyperlipidemia, unspecified: Secondary | ICD-10-CM

## 2011-10-12 DIAGNOSIS — F17200 Nicotine dependence, unspecified, uncomplicated: Secondary | ICD-10-CM

## 2011-10-12 MED ORDER — VARENICLINE TARTRATE 0.5 MG PO TABS: 0.5000 mg | ORAL_TABLET | Freq: Two times a day (BID) | ORAL | Status: AC

## 2011-10-12 MED ORDER — PRAVASTATIN SODIUM 80 MG PO TABS: 80.0000 mg | ORAL_TABLET | Freq: Every day | ORAL | Status: AC

## 2011-10-12 MED ORDER — PHENTERMINE HCL 37.5 MG PO CAPS: 37.5000 mg | ORAL_CAPSULE | ORAL | Status: AC

## 2011-10-12 NOTE — Progress Notes (Signed)
  Subjective:    Patient ID: Kimberly Richmond, female    DOB: 08-13-1975, 36 y.o.   MRN: [id number]  HPI Pt presents to clinic for followup of multiple medical problems. States still is smoking that wellbutrin did not make her quit. Previously found chantix more helpful but had nausea associated. Requests phentermine refill for wt loss. Wt down 2lbs. Is not exercising. Changed lipitor to pravachol and is tolerating without myalgia. lft's minimally elevated. tchol and ldl reviewed improved but suboptimally controlled.   Past Medical History  Diagnosis Date  . Rhinosinusitis   . Lymphocytosis   . Fatigue   . Head ache   . Tobacco abuse   . Positive TB test   . History of breast implant     bilateral   Past Surgical History  Procedure Date  . Breast surgery     bilateral breast implants  . Cosmetic surgery     tummy tuck  . Tubal ligation   . Endometrial ablation     reports that she has been smoking.  She has never used smokeless tobacco. She reports that she does not drink alcohol or use illicit drugs. family history is not on file. Allergies  Allergen Reactions  . Cephalexin   . Codeine   . Naproxen       Review of Systems see hpi     Objective:   Physical Exam  Physical Exam  Nursing note and vitals reviewed. Constitutional: Appears well-developed and well-nourished. No distress.  HENT:  Head: Normocephalic and atraumatic.  Right Ear: External ear normal.  Left Ear: External ear normal.  Eyes: Conjunctivae are normal. No scleral icterus.  Neck: Neck supple. Carotid bruit is not present.  Cardiovascular: Normal rate, regular rhythm and normal heart sounds.  Exam reveals no gallop and no friction rub.   No murmur heard. Pulmonary/Chest: Effort normal and breath sounds normal. No respiratory distress. He has no wheezes. no rales.  Lymphadenopathy:    He has no cervical adenopathy.  Neurological:Alert.  Skin: Skin is warm and dry. Not diaphoretic.  Psychiatric: Has a  normal mood and affect.        Assessment & Plan:

## 2011-10-12 NOTE — Assessment & Plan Note (Signed)
Agreed to rf phentermine for short term use but needs to begin regular exercise program with diet.

## 2011-10-12 NOTE — Assessment & Plan Note (Signed)
Increase pravachol 80mg  qd and recheck lipid/lft in ~6wks.

## 2011-10-12 NOTE — Telephone Encounter (Signed)
Lab order entered for June 2013. 

## 2011-10-12 NOTE — Assessment & Plan Note (Signed)
Wishes to reattempt chantix. Take 1/2 dose

## 2011-10-12 NOTE — Patient Instructions (Signed)
Please schedule lipid/lft in 6 weeks

## 2012-01-26 ENCOUNTER — Encounter: Payer: Self-pay | Admitting: Internal Medicine

## 2012-02-14 ENCOUNTER — Ambulatory Visit: Payer: Self-pay | Admitting: Internal Medicine

## 2012-02-20 ENCOUNTER — Ambulatory Visit: Payer: Self-pay | Admitting: Internal Medicine

## 2012-10-04 ENCOUNTER — Encounter (HOSPITAL_BASED_OUTPATIENT_CLINIC_OR_DEPARTMENT_OTHER): Payer: Self-pay | Admitting: *Deleted

## 2012-10-04 ENCOUNTER — Emergency Department (HOSPITAL_BASED_OUTPATIENT_CLINIC_OR_DEPARTMENT_OTHER)
Admission: EM | Admit: 2012-10-04 | Discharge: 2012-10-04 | Disposition: A | Discharge status: Home / Self Care | Payer: No Typology Code available for payment source | Attending: Emergency Medicine | Admitting: Emergency Medicine

## 2012-10-04 ENCOUNTER — Emergency Department (HOSPITAL_BASED_OUTPATIENT_CLINIC_OR_DEPARTMENT_OTHER): Payer: No Typology Code available for payment source

## 2012-10-04 DIAGNOSIS — S8990XA Unspecified injury of unspecified lower leg, initial encounter: Secondary | ICD-10-CM | POA: Insufficient documentation

## 2012-10-04 DIAGNOSIS — Z862 Personal history of diseases of the blood and blood-forming organs and certain disorders involving the immune mechanism: Secondary | ICD-10-CM | POA: Insufficient documentation

## 2012-10-04 DIAGNOSIS — Z79899 Other long term (current) drug therapy: Secondary | ICD-10-CM | POA: Insufficient documentation

## 2012-10-04 DIAGNOSIS — Y92410 Unspecified street and highway as the place of occurrence of the external cause: Secondary | ICD-10-CM | POA: Insufficient documentation

## 2012-10-04 DIAGNOSIS — R52 Pain, unspecified: Secondary | ICD-10-CM

## 2012-10-04 DIAGNOSIS — Z8709 Personal history of other diseases of the respiratory system: Secondary | ICD-10-CM | POA: Insufficient documentation

## 2012-10-04 DIAGNOSIS — S3981XA Other specified injuries of abdomen, initial encounter: Secondary | ICD-10-CM | POA: Insufficient documentation

## 2012-10-04 DIAGNOSIS — F17200 Nicotine dependence, unspecified, uncomplicated: Secondary | ICD-10-CM | POA: Insufficient documentation

## 2012-10-04 DIAGNOSIS — Y9389 Activity, other specified: Secondary | ICD-10-CM | POA: Insufficient documentation

## 2012-10-04 DIAGNOSIS — S99929A Unspecified injury of unspecified foot, initial encounter: Secondary | ICD-10-CM | POA: Insufficient documentation

## 2012-10-04 DIAGNOSIS — Z978 Presence of other specified devices: Secondary | ICD-10-CM | POA: Insufficient documentation

## 2012-10-04 DIAGNOSIS — S298XXA Other specified injuries of thorax, initial encounter: Secondary | ICD-10-CM | POA: Insufficient documentation

## 2012-10-04 LAB — URINALYSIS, ROUTINE W REFLEX MICROSCOPIC
Glucose, UA: NEGATIVE mg/dL
Ketones, ur: NEGATIVE mg/dL
Nitrite: NEGATIVE
Specific Gravity, Urine: 1.019 (ref 1.005–1.030)
pH: 6 (ref 5.0–8.0)

## 2012-10-04 LAB — URINE MICROSCOPIC-ADD ON

## 2012-10-04 MED ORDER — HYDROCODONE-ACETAMINOPHEN 5-325 MG PO TABS: 2.0000 | ORAL_TABLET | Freq: Four times a day (QID) | ORAL | Status: AC | PRN

## 2012-10-04 MED ORDER — HYDROCODONE-ACETAMINOPHEN 5-325 MG PO TABS
2.0000 | ORAL_TABLET | Freq: Once | ORAL | Status: AC
  Administered 2012-10-04: 2 via ORAL
  Filled 2012-10-04: qty 2
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
## 2014-01-06 ENCOUNTER — Ambulatory Visit: Payer: Self-pay | Admitting: Family

## 2014-01-06 DIAGNOSIS — Z0289 Encounter for other administrative examinations: Secondary | ICD-10-CM

## 2014-01-13 ENCOUNTER — Ambulatory Visit: Payer: Self-pay | Admitting: Family

## 2014-02-03 ENCOUNTER — Ambulatory Visit: Payer: Self-pay | Admitting: Family

## 2014-02-03 DIAGNOSIS — Z0289 Encounter for other administrative examinations: Secondary | ICD-10-CM
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
## 2014-10-04 ENCOUNTER — Emergency Department (HOSPITAL_BASED_OUTPATIENT_CLINIC_OR_DEPARTMENT_OTHER): Payer: Self-pay

## 2014-10-04 ENCOUNTER — Emergency Department (HOSPITAL_BASED_OUTPATIENT_CLINIC_OR_DEPARTMENT_OTHER)
Admission: EM | Admit: 2014-10-04 | Discharge: 2014-10-04 | Disposition: A | Discharge status: Home / Self Care | Payer: Self-pay | Attending: Emergency Medicine | Admitting: Emergency Medicine

## 2014-10-04 ENCOUNTER — Other Ambulatory Visit: Payer: Self-pay

## 2014-10-04 ENCOUNTER — Encounter (HOSPITAL_BASED_OUTPATIENT_CLINIC_OR_DEPARTMENT_OTHER): Payer: Self-pay | Admitting: *Deleted

## 2014-10-04 DIAGNOSIS — Z720 Tobacco use: Secondary | ICD-10-CM | POA: Insufficient documentation

## 2014-10-04 DIAGNOSIS — J40 Bronchitis, not specified as acute or chronic: Secondary | ICD-10-CM

## 2014-10-04 DIAGNOSIS — Z862 Personal history of diseases of the blood and blood-forming organs and certain disorders involving the immune mechanism: Secondary | ICD-10-CM | POA: Insufficient documentation

## 2014-10-04 DIAGNOSIS — R0602 Shortness of breath: Secondary | ICD-10-CM

## 2014-10-04 DIAGNOSIS — Z79899 Other long term (current) drug therapy: Secondary | ICD-10-CM | POA: Insufficient documentation

## 2014-10-04 MED ORDER — PREDNISONE 20 MG PO TABS: 40.0000 mg | ORAL_TABLET | Freq: Every day | ORAL | Status: AC

## 2014-10-04 MED ORDER — PREDNISONE 50 MG PO TABS
60.0000 mg | ORAL_TABLET | Freq: Once | ORAL | Status: AC
  Administered 2014-10-04: 60 mg via ORAL
  Filled 2014-10-04 (×2): qty 1

## 2014-10-04 MED ORDER — ALBUTEROL SULFATE HFA 108 (90 BASE) MCG/ACT IN AERS
1.0000 | INHALATION_SPRAY | RESPIRATORY_TRACT | Status: DC | PRN
  Administered 2014-10-04: 2 via RESPIRATORY_TRACT
  Filled 2014-10-04: qty 6.7

## 2014-10-04 MED ORDER — ALBUTEROL SULFATE (2.5 MG/3ML) 0.083% IN NEBU
5.0000 mg | INHALATION_SOLUTION | Freq: Once | RESPIRATORY_TRACT | Status: AC
  Administered 2014-10-04: 5 mg via RESPIRATORY_TRACT
  Filled 2014-10-04: qty 6

## 2014-10-04 NOTE — ED Provider Notes (Signed)
CSN: [id number]     Arrival date & time 10/04/14  1333 History  This chart was scribed for Gwyneth SproutWhitney Gerlean Cid, MD by Ronney LionSuzanne Le, ED Scribe. This patient was seen in room MH10/MH10 and the patient's care was started at 5:16 PM.    Chief Complaint  Patient presents with  . Chest Pain   The history is provided by the patient. No language interpreter was used.    HPI Comments: Kimberly Richmond is a 39 y.o. female who presents to the Emergency Department complaining of central chest pain that has been ongoing for 3 days. She endorses associated SOB, an occasional cough, occasional wheezing, and nasal congestion. Lying down and palpation exacerbates the pain. She is unsure whether she has a history of allergies. Patient is a current smoker, and has used an inhaler in the past. Patient takes hydroxyzine regularly for itching. She denies any other medical conditions. She denies nausea, vomiting, or leg swelling. Patient denies BCP use (she uses Novasure) or any recent travel. She has known allergies to codeine.  Past Medical History  Diagnosis Date  . Rhinosinusitis   . Lymphocytosis   . Fatigue   . Head ache   . Tobacco abuse   . Positive TB test   . History of breast implant     bilateral   Past Surgical History  Procedure Laterality Date  . Breast surgery      bilateral breast implants  . Cosmetic surgery      tummy tuck  . Tubal ligation    . Endometrial ablation     No family history on file. History  Substance Use Topics  . Smoking status: Current Every Day Smoker  . Smokeless tobacco: Never Used  . Alcohol Use: No   OB History    No data available     Review of Systems   A complete 10 system review of systems was obtained and all systems are negative except as noted in the HPI and PMH.    Allergies  Cephalexin; Codeine; and Naproxen  Home Medications   Prior to Admission medications   Medication Sig Start Date End Date Taking? Authorizing Provider  albuterol (PROVENTIL  HFA;VENTOLIN HFA) 108 (90 BASE) MCG/ACT inhaler Inhale 2 puffs into the lungs every 6 (six) hours as needed for wheezing. 02/28/11 02/28/12  Edwyna Perfecthomas W Hodgin, MD  HYDROcodone-acetaminophen (NORCO/VICODIN) 5-325 MG per tablet Take 2 tablets by mouth every 6 (six) hours as needed for pain. 10/04/12   Doug SouSam Jacubowitz, MD  hydrOXYzine (ATARAX/VISTARIL) 25 MG tablet Take 25 mg by mouth 3 (three) times daily as needed for itching.    Historical Provider, MD  phentermine 37.5 MG capsule Take 1 capsule (37.5 mg total) by mouth every morning. 02/28/11 03/30/11  Edwyna Perfecthomas W Hodgin, MD  phentermine 37.5 MG capsule Take 1 capsule (37.5 mg total) by mouth every morning. 04/11/11 05/11/11  Edwyna Perfecthomas W Hodgin, MD  phentermine 37.5 MG capsule Take 1 capsule (37.5 mg total) by mouth every morning. 06/20/11 07/20/11  Edwyna Perfecthomas W Hodgin, MD  phentermine 37.5 MG capsule Take 1 capsule (37.5 mg total) by mouth every morning. 10/12/11 11/11/11  Edwyna Perfecthomas W Hodgin, MD  pravastatin (PRAVACHOL) 80 MG tablet Take 1 tablet (80 mg total) by mouth daily. 10/12/11 10/11/12  Edwyna Perfecthomas W Hodgin, MD   BP 115/66 mmHg  Pulse 62  Temp(Src) 98.3 F (36.8 C) (Oral)  Resp 14  Ht 5\' 2"  (1.575 m)  Wt 160 lb (72.576 kg)  BMI 29.26 kg/m2  SpO2 99%  Physical Exam  Constitutional: She is oriented to person, place, and time. She appears well-developed and well-nourished. No distress.  HENT:  Head: Normocephalic and atraumatic.  Nasal turbinates edematous bilaterally. Moist mm.  Eyes: Conjunctivae and EOM are normal.  Neck: Neck supple. No tracheal deviation present.  Cardiovascular: Normal rate, regular rhythm and normal heart sounds.   Pulmonary/Chest: Effort normal. No respiratory distress. She has decreased breath sounds. She has no wheezes. She has no rhonchi. She has no rales.  Anterior chest wall tenderness. Breath sounds normal, although there are slight decreased breath sounds throughout.  Musculoskeletal: Normal range of motion.  Neurological: She is alert  and oriented to person, place, and time.  Skin: Skin is warm and dry.  Psychiatric: She has a normal mood and affect. Her behavior is normal.  Nursing note and vitals reviewed.   ED Course  Procedures (including critical care time) Labs Review  DIAGNOSTIC STUDIES: Oxygen Saturation is 99% on room air, normal by my interpretation.    COORDINATION OF CARE: 5:21 PM - Discussed treatment plan with pt at bedside which includes breathing treatment and CXR, and pt agreed to plan.   Labs Reviewed - No data to display  Imaging Review Dg Chest 2 View  10/04/2014   CLINICAL DATA:  39 year old female with a history of chest pain for 3 days  EXAM: CHEST - 2 VIEW  COMPARISON:  10/04/2012, 02/22/2009, CT 08/24/2008, 12/03/2006  FINDINGS: Cardiomediastinal silhouette within normal limits in size and contour.  No evidence of pulmonary vascular congestion.  No pneumothorax or confluent airspace disease.  No pleural effusion.  Diffusely coarsened interstitial markings.  Linear opacity at the left costophrenic angle, unchanged from prior compatible with atelectasis/scarring.  No displaced fracture.  Unremarkable appearance of the upper abdomen.  IMPRESSION: No evidence of lobar pneumonia or edema. There are coarsened interstitial markings, more pronounced than the prior, potentially representing bronchiolitis/ bronchitis.  Signed,  Yvone Neu. Loreta Ave, DO  [facility]  [facility]   Electronically Signed   By: Gilmer Mor D.O.   On: 10/04/2014 18:21     EKG Interpretation   Date/Time:  Sunday October 04 2014 13:45:05 EDT Ventricular Rate:  55 PR Interval:  156 QRS Duration: 80 QT Interval:  446 QTC Calculation: 426 R Axis:   69 Text Interpretation:  Sinus bradycardia Low voltage QRS Septal infarct ,  age undetermined Reconfirmed by [facility]  MD, Midori Dado ([id number]) on 10/04/2014  4:58:28 PM      MDM   Final diagnoses:  SOB (shortness of breath)  Bronchitis    Pt with symptoms consistent with viral URI.  Well appearing here.  No signs of breathing difficulty  No signs of pharyngitis, otitis or abnormal abdominal findings.   CXR wnl and pt to return with any further problems.  Low suspicion for cardiac cause of patient's symptoms today and perked negative.   I personally performed the services described in this documentation, which was scribed in my presence.  The recorded information has been reviewed and considered.   Gwyneth Sprout, MD 10/05/14 0010

## 2014-10-04 NOTE — ED Notes (Signed)
Pt here for CP x3 days.  Increase in pain with palpation and movement and coughing.  Pt was seen at [facility] and was told that her heart is ok.

## 2016-05-21 IMAGING — DX DG CHEST 2V
2 series · 2 of 2 positions shown · non-contrast
Comparison: 10/04/2012, 02/22/2009, CT 08/24/2008, 12/03/2006

CLINICAL DATA: 39-year-old female with a history of chest pain for
3 days

EXAM:
CHEST - 2 VIEW

[chest pa]
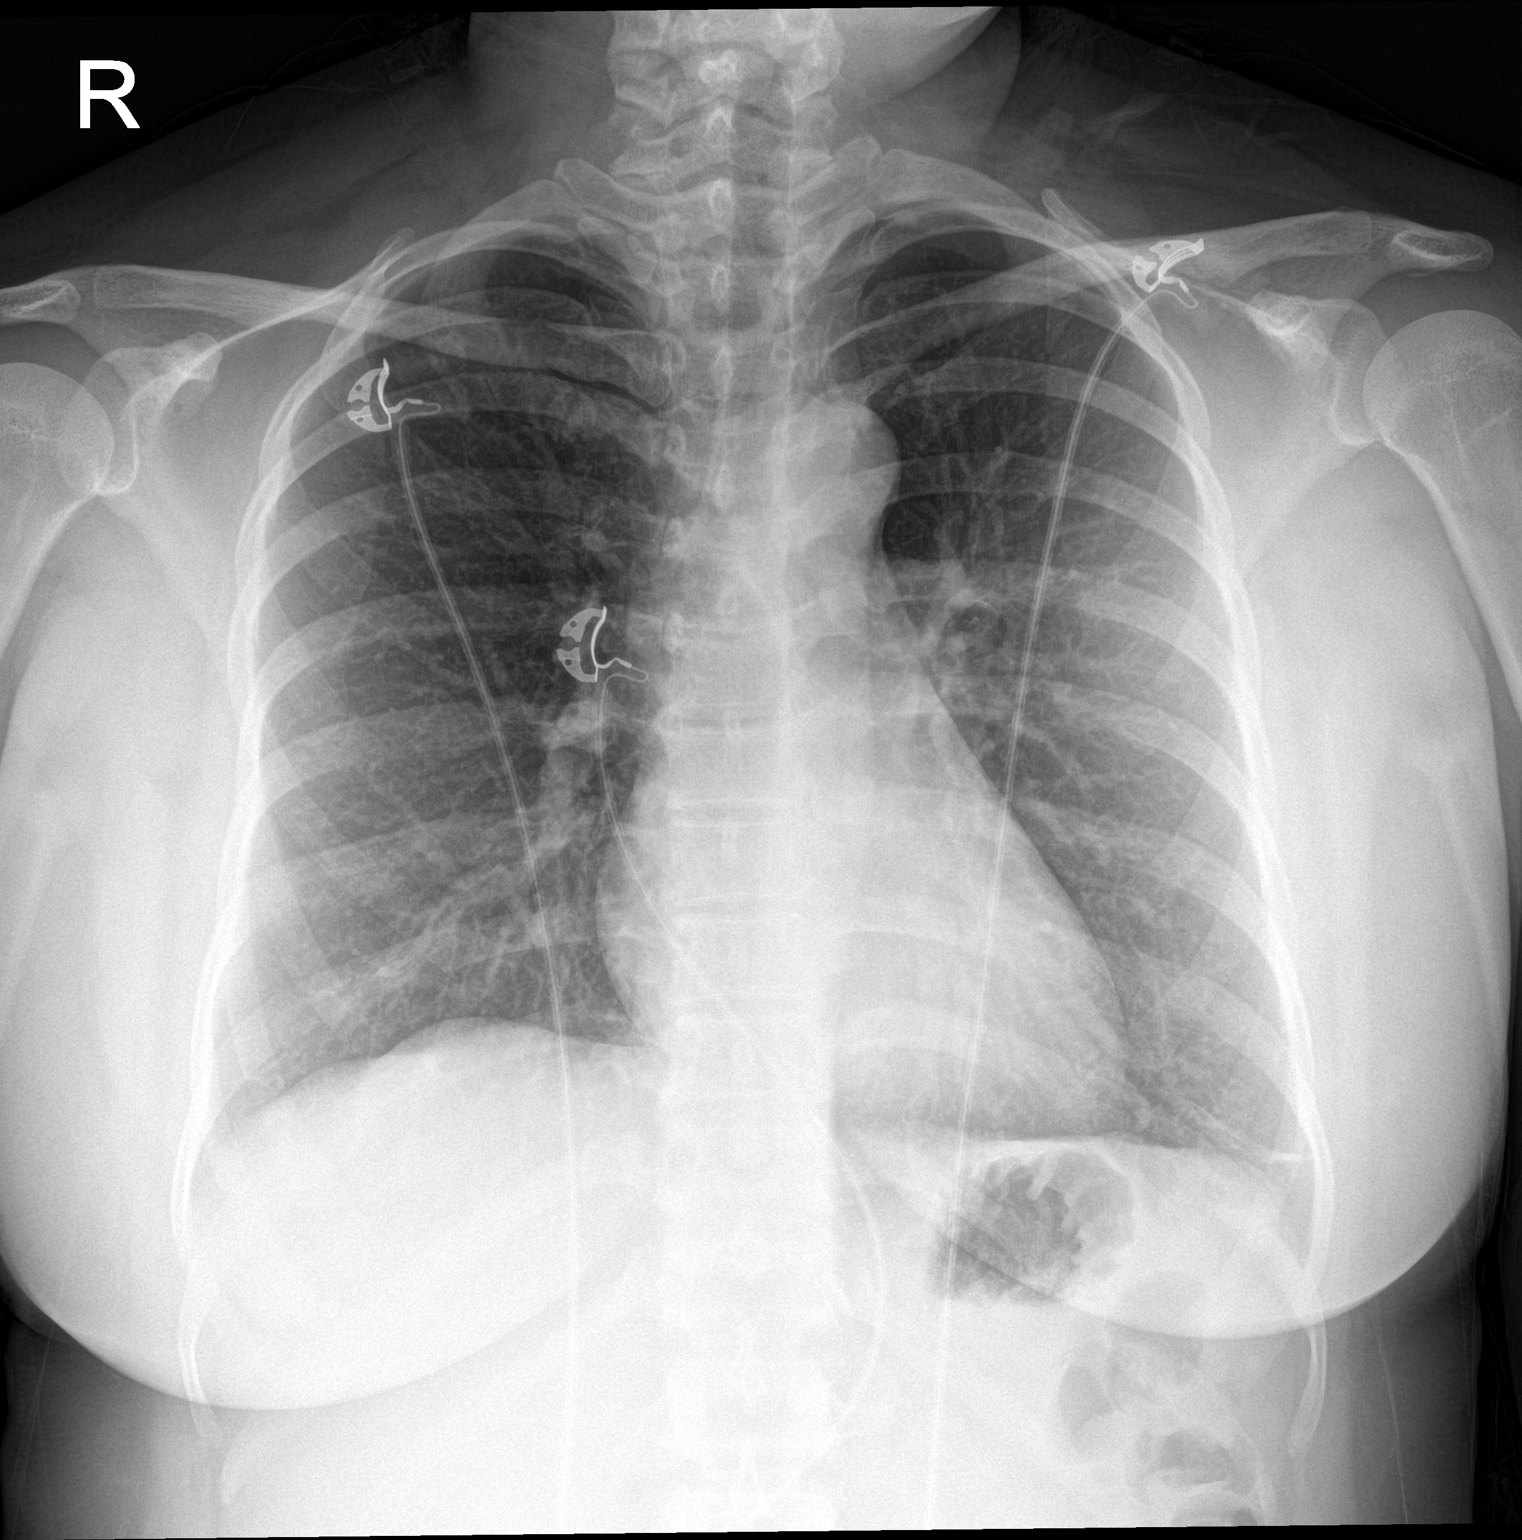

[chest lat]
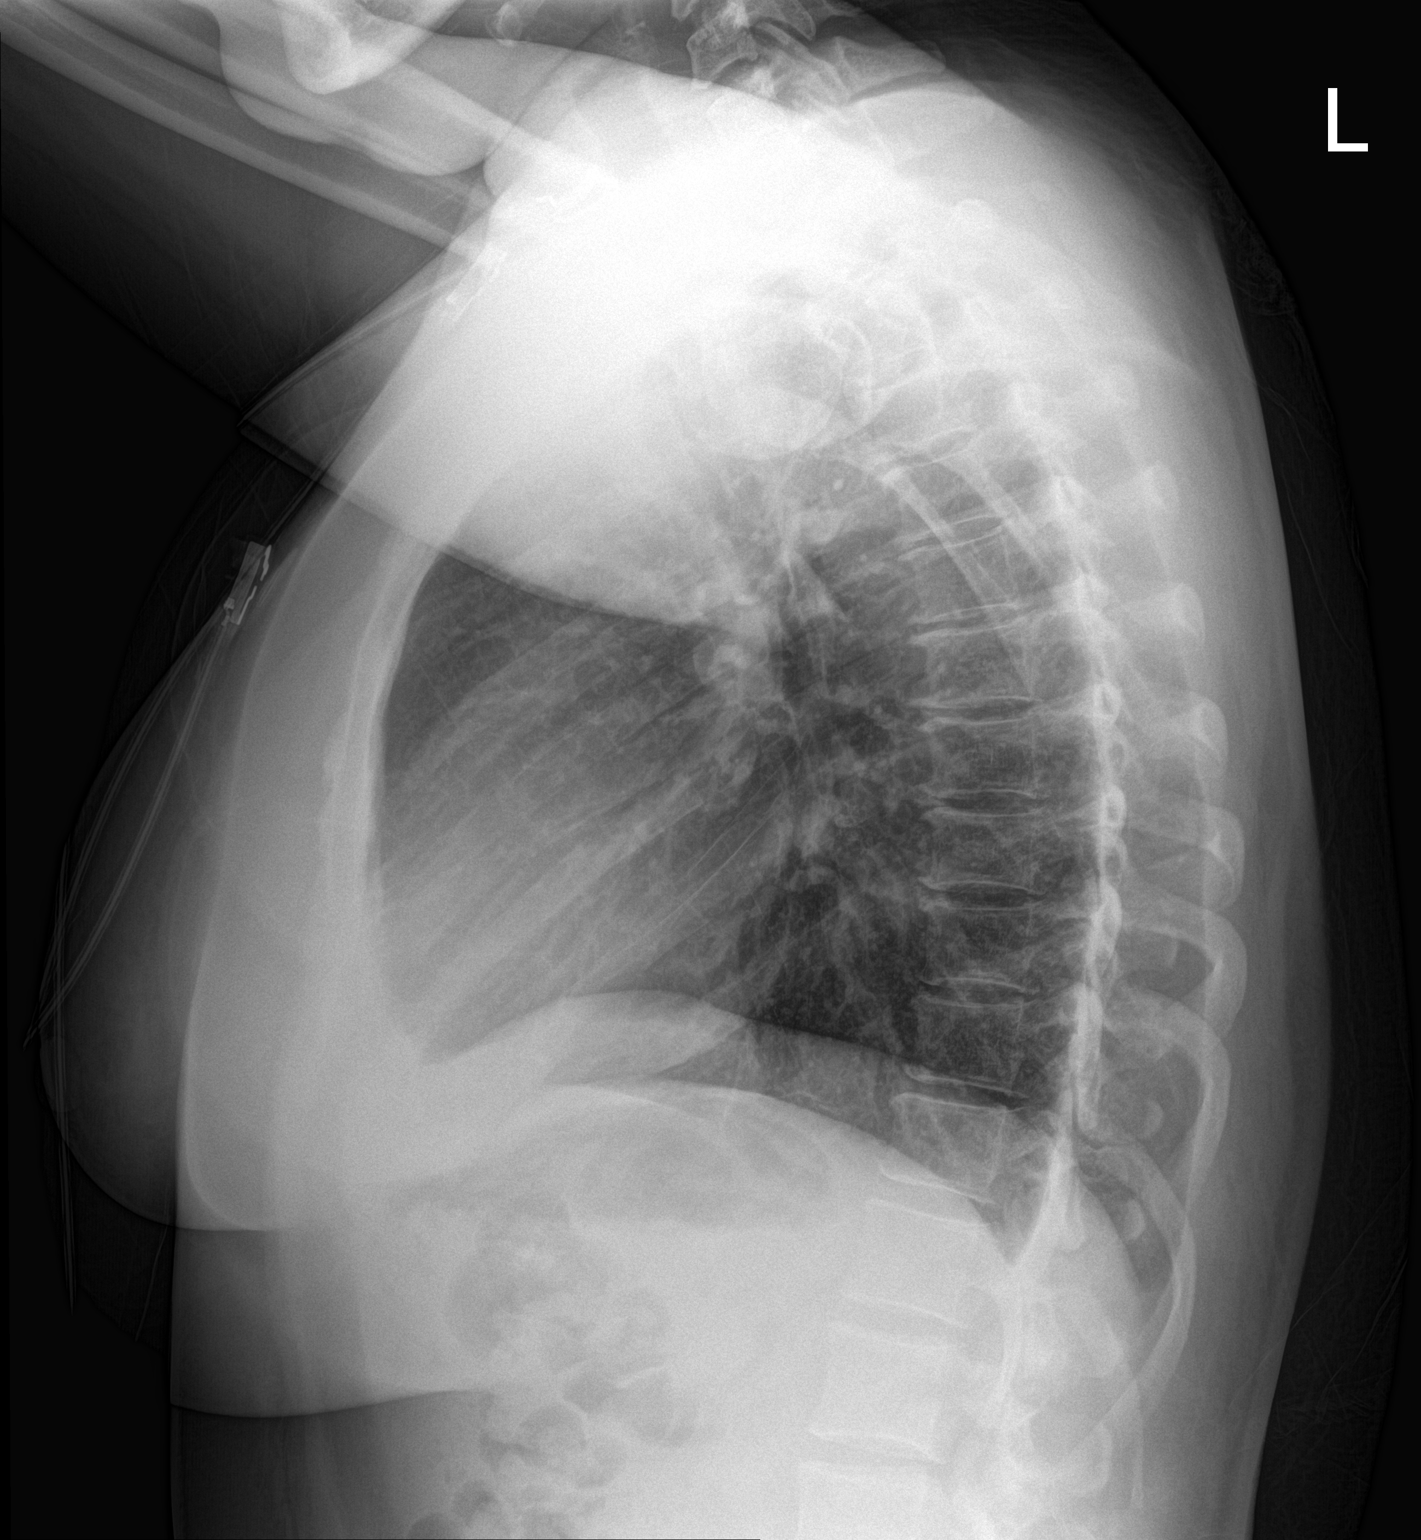

[2 of 2 positions shown; findings below may reference images not displayed]

FINDINGS: Cardiomediastinal silhouette within normal limits in size and
contour.

No evidence of pulmonary vascular congestion.

No pneumothorax or confluent airspace disease.  No pleural effusion.

Diffusely coarsened interstitial markings.

Linear opacity at the left costophrenic angle, unchanged from prior
compatible with atelectasis/scarring.

No displaced fracture.

Unremarkable appearance of the upper abdomen.
IMPRESSION: No evidence of lobar pneumonia or edema. There are coarsened
interstitial markings, more pronounced than the prior, potentially
representing bronchiolitis/ bronchitis.
